# Patient Record
Sex: Female | Born: 1990 | Race: White | Hispanic: No | Marital: Single | Smoking: Current every day smoker
Health system: Southern US, Community
[De-identification: ages and names within clinical notes are randomized; demographics above are authoritative.]

## PROBLEM LIST (undated history)

## (undated) DIAGNOSIS — E669 Obesity, unspecified: Secondary | ICD-10-CM

## (undated) DIAGNOSIS — F4310 Post-traumatic stress disorder, unspecified: Secondary | ICD-10-CM

## (undated) DIAGNOSIS — F419 Anxiety disorder, unspecified: Secondary | ICD-10-CM

## (undated) DIAGNOSIS — F319 Bipolar disorder, unspecified: Secondary | ICD-10-CM

## (undated) DIAGNOSIS — J45909 Unspecified asthma, uncomplicated: Secondary | ICD-10-CM

## (undated) DIAGNOSIS — M419 Scoliosis, unspecified: Secondary | ICD-10-CM

## (undated) HISTORY — PX: KNEE SURGERY: SHX244

## (undated) HISTORY — PX: APPENDECTOMY: SHX54

## (undated) HISTORY — PX: ADENOIDECTOMY: SUR15

## (undated) HISTORY — PX: CHOLECYSTECTOMY: SHX55

## (undated) HISTORY — PX: WISDOM TOOTH EXTRACTION: SHX21

---

## 2016-11-13 ENCOUNTER — Emergency Department (HOSPITAL_COMMUNITY): Payer: Self-pay

## 2016-11-13 ENCOUNTER — Encounter (HOSPITAL_COMMUNITY): Payer: Self-pay | Admitting: *Deleted

## 2016-11-13 ENCOUNTER — Emergency Department (HOSPITAL_COMMUNITY)
Admission: EM | Admit: 2016-11-13 | Discharge: 2016-11-13 | Disposition: A | Discharge status: Home / Self Care | Payer: Self-pay | Attending: Emergency Medicine | Admitting: Emergency Medicine

## 2016-11-13 DIAGNOSIS — R262 Difficulty in walking, not elsewhere classified: Secondary | ICD-10-CM | POA: Insufficient documentation

## 2016-11-13 DIAGNOSIS — M79675 Pain in left toe(s): Secondary | ICD-10-CM | POA: Insufficient documentation

## 2016-11-13 DIAGNOSIS — J45909 Unspecified asthma, uncomplicated: Secondary | ICD-10-CM | POA: Insufficient documentation

## 2016-11-13 DIAGNOSIS — F17210 Nicotine dependence, cigarettes, uncomplicated: Secondary | ICD-10-CM | POA: Insufficient documentation

## 2016-11-13 HISTORY — DX: Obesity, unspecified: E66.9

## 2016-11-13 HISTORY — DX: Scoliosis, unspecified: M41.9

## 2016-11-13 HISTORY — DX: Unspecified asthma, uncomplicated: J45.909

## 2016-11-13 NOTE — Discharge Instructions (Signed)
Follow-up with foot specialists listed below for further evaluation. Take ibuprofen or Aleve as needed for pain and inflammation. Weight-bear as tolerated. Return to ED for worsening pain, increased redness, injury or signs of infection.

## 2016-11-13 NOTE — ED Provider Notes (Signed)
[facility]-EMERGENCY DEPT Provider Note   CSN: [id number] Arrival date & time: 11/13/16  1459     History   Chief Complaint Chief Complaint  Patient presents with  . Toe Pain    HPI Katherine Rivas is a 26 y.o. female.  HPI  Patient presents to ED for evaluation of 2 day history of left pinky toe pain. Describes the pain as throbbing and worse with weightbearing. Symptoms began after waking up 2 days ago. She is unsure if she injured the area. She is using crutches with some relief in her symptoms. She has tried ibuprofen, Tylenol with some relief in her symptoms. She does have a history of similar symptoms in her right foot several years ago which resolved on its own with buddy tape. She denies any numbness, fevers, chills, drainage from site.  Past Medical History:  Diagnosis Date  . Asthma   . Obesity   . Scoliosis     There are no active problems to display for this patient.   Past Surgical History:  Procedure Laterality Date  . APPENDECTOMY    . CHOLECYSTECTOMY    . KNEE SURGERY    . TONSILLECTOMY      OB History    No data available       Home Medications    Prior to Admission medications   Not on File    Family History No family history on file.  Social History Social History  Substance Use Topics  . Smoking status: Current Every Day Smoker    Packs/day: 0.35    Types: Cigarettes  . Smokeless tobacco: Never Used  . Alcohol use No     Allergies   Zithromax [azithromycin]   Review of Systems Review of Systems  Constitutional: Negative for chills and fever.  Musculoskeletal: Positive for arthralgias and gait problem. Negative for joint swelling and myalgias.  Skin: Negative for color change, rash and wound.     Physical Exam Updated Vital Signs BP 113/70 (BP Location: Left Arm)   Pulse 93   Temp 98.1 F (36.7 C) (Oral)   Resp 18   LMP 10/18/2016 (Approximate)   SpO2 98%   Physical Exam  Constitutional: She appears  well-developed and well-nourished. No distress.  HENT:  Head: Normocephalic and atraumatic.  Eyes: Conjunctivae and EOM are normal. No scleral icterus.  Neck: Normal range of motion.  Pulmonary/Chest: Effort normal. No respiratory distress.  Musculoskeletal: She exhibits tenderness. She exhibits no edema or deformity.  Tenderness to palpation of the left pinky toe. Mild edema noted. No color or temperature change noted. No palpable abscess or site of drainage noted. No visible wound. Pain with flexion of toe.  Neurological: She is alert.  Skin: No rash noted. She is not diaphoretic.  Psychiatric: She has a normal mood and affect.  Nursing note and vitals reviewed.    ED Treatments / Results  Labs (all labs ordered are listed, but only abnormal results are displayed) Labs Reviewed - No data to display  EKG  EKG Interpretation None       Radiology Dg Foot Complete Left  Result Date: 11/13/2016 CLINICAL DATA:  Lateral left foot pain, including the fourth and fifth toes. No known injury. EXAM: LEFT FOOT - COMPLETE 3+ VIEW COMPARISON:  None. FINDINGS: There is no evidence of fracture or dislocation. There is no evidence of arthropathy or other focal bone abnormality. Soft tissues are unremarkable. IMPRESSION: Normal examination. Electronically Signed   By: Zada Finders.D.  On: 11/13/2016 17:35    Procedures Procedures (including critical care time)  Medications Ordered in ED Medications - No data to display   Initial Impression / Assessment and Plan / ED Course  I have reviewed the triage vital signs and the nursing notes.  Pertinent labs & imaging results that were available during my care of the patient were reviewed by me and considered in my medical decision making (see chart for details).     Patient presents to ED for left pinky toe pain for the past 2 days. Unsure of injury. Some relief in symptoms with anti-inflammatory medications. There is tenderness present on  the pinky toe but no evidence of paronychia or other infectious cause. No visible deformity or wound noted. X-ray of foot returned as negative for acute abnormality of the bones or soft tissues. Patient reports relief in the past with similar symptoms on the right foot with buddy tape. We'll buddy tape these toes and advised to continue anti-inflammatories as needed. Will refer to podiatry for further evaluation if symptoms persist. Strict return precautions given.  Final Clinical Impressions(s) / ED Diagnoses   Final diagnoses:  Pain of toe of left foot    New Prescriptions New Prescriptions   No medications on file     Dietrich PatesKhatri, Laurann Mcmorris, Cordelia Poche-C 11/13/16 1818    Linwood DibblesKnapp, Jon, MD 11/13/16 2337

## 2016-11-13 NOTE — ED Triage Notes (Signed)
Pt c/o L pinky toe pain onset x 2 days, pt denies injury to the area, no redness to the toe, small amt of swelling present, skin intact, A&o X4

## 2017-06-22 ENCOUNTER — Emergency Department (HOSPITAL_COMMUNITY)
Admission: EM | Admit: 2017-06-22 | Discharge: 2017-06-22 | Disposition: A | Discharge status: Home / Self Care | Payer: Self-pay | Attending: Emergency Medicine | Admitting: Emergency Medicine

## 2017-06-22 ENCOUNTER — Encounter (HOSPITAL_COMMUNITY): Payer: Self-pay

## 2017-06-22 DIAGNOSIS — F17210 Nicotine dependence, cigarettes, uncomplicated: Secondary | ICD-10-CM | POA: Insufficient documentation

## 2017-06-22 DIAGNOSIS — Y999 Unspecified external cause status: Secondary | ICD-10-CM | POA: Insufficient documentation

## 2017-06-22 DIAGNOSIS — S39012A Strain of muscle, fascia and tendon of lower back, initial encounter: Secondary | ICD-10-CM | POA: Insufficient documentation

## 2017-06-22 DIAGNOSIS — T148XXA Other injury of unspecified body region, initial encounter: Secondary | ICD-10-CM

## 2017-06-22 DIAGNOSIS — J45909 Unspecified asthma, uncomplicated: Secondary | ICD-10-CM | POA: Insufficient documentation

## 2017-06-22 DIAGNOSIS — Y929 Unspecified place or not applicable: Secondary | ICD-10-CM | POA: Insufficient documentation

## 2017-06-22 DIAGNOSIS — X501XXA Overexertion from prolonged static or awkward postures, initial encounter: Secondary | ICD-10-CM | POA: Insufficient documentation

## 2017-06-22 DIAGNOSIS — Y939 Activity, unspecified: Secondary | ICD-10-CM | POA: Insufficient documentation

## 2017-06-22 HISTORY — DX: Post-traumatic stress disorder, unspecified: F43.10

## 2017-06-22 HISTORY — DX: Anxiety disorder, unspecified: F41.9

## 2017-06-22 HISTORY — DX: Bipolar disorder, unspecified: F31.9

## 2017-06-22 MED ORDER — METHOCARBAMOL 500 MG PO TABS: 500.0000 mg | ORAL_TABLET | Freq: Two times a day (BID) | ORAL | 0 refills | Status: DC

## 2017-06-22 MED ORDER — DICLOFENAC SODIUM 75 MG PO TBEC: 75.0000 mg | DELAYED_RELEASE_TABLET | Freq: Two times a day (BID) | ORAL | 0 refills | Status: DC

## 2017-06-22 NOTE — ED Triage Notes (Signed)
Patient states she was sitting on the toilet yesterday and was stretching her back out when she felt and heard a pop. Patient states she had right lower back pain when she tried to get off of the toilet.  Patient states pain in the right leg when she lays a certain way.

## 2017-06-22 NOTE — ED Provider Notes (Signed)
[facility]-EMERGENCY DEPT Provider Note   CSN: [id number] Arrival date & time: 06/22/17  1330     History   Chief Complaint Chief Complaint  Patient presents with  . Back Pain    HPI Katherine Rivas is a 27 y.o. female.  The history is provided by the patient. No language interpreter was used.  Back Pain   This is a new problem. The current episode started 2 days ago. The problem occurs constantly. The problem has not changed since onset.The pain is associated with twisting. The pain is present in the lumbar spine. The quality of the pain is described as aching. The pain does not radiate. The pain is moderate. Pertinent negatives include no chest pain and no abdominal pain. She has tried nothing for the symptoms. The treatment provided no relief.  Pt complains of twisting to pop her back and having sudden pain  Past Medical History:  Diagnosis Date  . Anxiety   . Asthma   . Bipolar 1 disorder (HCC)   . Obesity   . PTSD (post-traumatic stress disorder)   . Scoliosis     There are no active problems to display for this patient.   Past Surgical History:  Procedure Laterality Date  . ADENOIDECTOMY    . APPENDECTOMY    . CHOLECYSTECTOMY    . KNEE SURGERY       OB History   None      Home Medications    Prior to Admission medications   Medication Sig Start Date End Date Taking? Authorizing Provider  diclofenac (VOLTAREN) 75 MG EC tablet Take 1 tablet (75 mg total) by mouth 2 (two) times daily. 06/22/17   Elson Areas, PA-C  methocarbamol (ROBAXIN) 500 MG tablet Take 1 tablet (500 mg total) by mouth 2 (two) times daily. 06/22/17   Elson Areas, PA-C    Family History Family History  Problem Relation Age of Onset  . Anxiety disorder Mother     Social History Social History   Tobacco Use  . Smoking status: Current Every Day Smoker    Packs/day: 0.35    Types: Cigarettes  . Smokeless tobacco: Never Used  Substance Use Topics   . Alcohol use: No  . Drug use: No     Allergies   Pumpkin flavor and Zithromax [azithromycin]   Review of Systems Review of Systems  Cardiovascular: Negative for chest pain.  Gastrointestinal: Negative for abdominal pain.  Musculoskeletal: Positive for back pain.  All other systems reviewed and are negative.    Physical Exam Updated Vital Signs BP (!) 138/96 (BP Location: Right Arm)   Pulse 85   Temp 97.7 F (36.5 C) (Oral)   Resp 18   Ht 5\' 7"  (1.702 m)   Wt 121.1 kg (267 lb)   LMP 05/14/2017 Comment: irregular  SpO2 99%   BMI 41.82 kg/m   Physical Exam  Constitutional: She is oriented to person, place, and time. She appears well-developed and well-nourished.  HENT:  Head: Normocephalic.  Eyes: EOM are normal.  Neck: Normal range of motion.  Pulmonary/Chest: Effort normal.  Abdominal: She exhibits no distension.  Musculoskeletal: Normal range of motion. She exhibits tenderness.  Tender right lower back, spine nontender  Pain with movement.  nv and ns intact  Neurological: She is alert and oriented to person, place, and time.  Psychiatric: She has a normal mood and affect.  Nursing note and vitals reviewed.    ED Treatments / Results  Labs (all labs ordered are listed, but only abnormal results are displayed) Labs Reviewed - No data to display  EKG None  Radiology No results found.  Procedures Procedures (including critical care time)  Medications Ordered in ED Medications - No data to display   Initial Impression / Assessment and Plan / ED Course  I have reviewed the triage vital signs and the nursing notes.  Pertinent labs & imaging results that were available during my care of the patient were reviewed by me and considered in my medical decision making (see chart for details).    MDM  Pain seems muscular  Pt counseled on muscle strain.  Pt advised to follow up with wellness clinic if pain persist.  Final Clinical Impressions(s) / ED  Diagnoses   Final diagnoses:  Muscle strain    ED Discharge Orders        Ordered    diclofenac (VOLTAREN) 75 MG EC tablet  2 times daily     06/22/17 1447    methocarbamol (ROBAXIN) 500 MG tablet  2 times daily     06/22/17 1447    An After Visit Summary was printed and given to the patient.    Elson AreasSofia, Leslie K, [place]PA-C 06/22/17 1509    Nira Connardama, Pedro Eduardo, MD 06/24/17 [phone]

## 2017-06-30 ENCOUNTER — Emergency Department (HOSPITAL_COMMUNITY)
Admission: EM | Admit: 2017-06-30 | Discharge: 2017-06-30 | Discharge status: Against Medical Advice | Payer: Self-pay | Attending: Emergency Medicine | Admitting: Emergency Medicine

## 2017-06-30 DIAGNOSIS — Z5321 Procedure and treatment not carried out due to patient leaving prior to being seen by health care provider: Secondary | ICD-10-CM | POA: Insufficient documentation

## 2017-07-03 ENCOUNTER — Encounter (HOSPITAL_COMMUNITY): Payer: Self-pay | Admitting: Emergency Medicine

## 2017-07-03 ENCOUNTER — Emergency Department (HOSPITAL_COMMUNITY): Payer: Self-pay

## 2017-07-03 ENCOUNTER — Other Ambulatory Visit: Payer: Self-pay

## 2017-07-03 ENCOUNTER — Emergency Department (HOSPITAL_COMMUNITY)
Admission: EM | Admit: 2017-07-03 | Discharge: 2017-07-03 | Disposition: A | Discharge status: Home / Self Care | Payer: Self-pay | Attending: Emergency Medicine | Admitting: Emergency Medicine

## 2017-07-03 DIAGNOSIS — S6992XA Unspecified injury of left wrist, hand and finger(s), initial encounter: Secondary | ICD-10-CM | POA: Insufficient documentation

## 2017-07-03 DIAGNOSIS — Y929 Unspecified place or not applicable: Secondary | ICD-10-CM | POA: Insufficient documentation

## 2017-07-03 DIAGNOSIS — Y999 Unspecified external cause status: Secondary | ICD-10-CM | POA: Insufficient documentation

## 2017-07-03 DIAGNOSIS — F17210 Nicotine dependence, cigarettes, uncomplicated: Secondary | ICD-10-CM | POA: Insufficient documentation

## 2017-07-03 DIAGNOSIS — J45909 Unspecified asthma, uncomplicated: Secondary | ICD-10-CM | POA: Insufficient documentation

## 2017-07-03 DIAGNOSIS — W19XXXA Unspecified fall, initial encounter: Secondary | ICD-10-CM | POA: Insufficient documentation

## 2017-07-03 DIAGNOSIS — Y939 Activity, unspecified: Secondary | ICD-10-CM | POA: Insufficient documentation

## 2017-07-03 NOTE — ED Provider Notes (Signed)
[facility]-EMERGENCY DEPT Provider Note   CSN: [id number] Arrival date & time: 07/03/17  1256     History   Chief Complaint Chief Complaint  Patient presents with  . Hand Injury    HPI Katherine Rivas is a 27 y.o. female who presents to the ED with left hand pain. Patient reports falling 5 days ago and since then the hand has been swollen and painful.   The history is provided by the patient. No language interpreter was used.  Hand Injury   The incident occurred more than 2 days ago. The injury mechanism was a fall. The quality of the pain is described as aching. The pain is moderate. The pain has been constant since the incident. She reports no foreign bodies present. She has tried nothing for the symptoms.    Past Medical History:  Diagnosis Date  . Anxiety   . Asthma   . Bipolar 1 disorder (HCC)   . Obesity   . PTSD (post-traumatic stress disorder)   . Scoliosis     There are no active problems to display for this patient.   Past Surgical History:  Procedure Laterality Date  . ADENOIDECTOMY    . APPENDECTOMY    . CHOLECYSTECTOMY    . KNEE SURGERY    . WISDOM TOOTH EXTRACTION       OB History   None      Home Medications    Prior to Admission medications   Medication Sig Start Date End Date Taking? Authorizing Provider  diclofenac (VOLTAREN) 75 MG EC tablet Take 1 tablet (75 mg total) by mouth 2 (two) times daily. 06/22/17   Elson Areas, PA-C  methocarbamol (ROBAXIN) 500 MG tablet Take 1 tablet (500 mg total) by mouth 2 (two) times daily. 06/22/17   Elson Areas, PA-C    Family History Family History  Problem Relation Age of Onset  . Anxiety disorder Mother     Social History Social History   Tobacco Use  . Smoking status: Current Every Day Smoker    Packs/day: 0.35    Types: Cigarettes  . Smokeless tobacco: Never Used  Substance Use Topics  . Alcohol use: Yes    Comment: occ  . Drug use: No      Allergies   Pumpkin flavor and Zithromax [azithromycin]   Review of Systems Review of Systems  Musculoskeletal: Positive for arthralgias.       Left hand pain  All other systems reviewed and are negative.    Physical Exam Updated Vital Signs BP 139/84 (BP Location: Right Arm)   Pulse 96   Temp 97.7 F (36.5 C) (Oral)   Resp 18   Wt 121.6 kg (268 lb)   LMP 06/23/2017 (Exact Date)   SpO2 98%   BMI 41.97 kg/m   Physical Exam  Constitutional: She appears well-developed and well-nourished. No distress.  HENT:  Head: Normocephalic and atraumatic.  Eyes: EOM are normal.  Neck: Neck supple.  Cardiovascular: Normal rate.  Pulmonary/Chest: Effort normal.  Musculoskeletal: Normal range of motion.       Left hand: She exhibits tenderness. She exhibits normal range of motion, normal capillary refill, no deformity, no laceration and no swelling. Normal sensation noted. Normal strength noted. She exhibits no thumb/finger opposition.       Hands: Neurological: She is alert.  Skin: Skin is warm and dry.  Psychiatric: She has a normal mood and affect.  Nursing note and vitals reviewed.  ED Treatments / Results  Labs (all labs ordered are listed, but only abnormal results are displayed) Labs Reviewed - No data to display  Radiology Dg Hand Complete Left  Result Date: 07/03/2017 CLINICAL DATA:  Pain to LEFT hand. Fell 5 days ago. Pain continues. Fifth metacarpal discomfort is reported. EXAM: LEFT HAND - COMPLETE 3+ VIEW COMPARISON:  None. FINDINGS: There is no evidence of fracture or dislocation. There is no evidence of arthropathy or other focal bone abnormality. Soft tissues are unremarkable. IMPRESSION: Negative. Electronically Signed   By: Elsie Stain M.D.   On: 07/03/2017 15:25    Procedures Procedures (including critical care time)  Medications Ordered in ED Medications - No data to display   Initial Impression / Assessment and Plan / ED Course  I have  reviewed the triage vital signs and the nursing notes. 27 y.o. female with left hand pain s/p fall 5 days ago stable for d/c without fracture or dislocation noted on x ray. No focal neuro deficits. Ace wrap, ice, elevation and pain management. Return precautions discussed.   Final Clinical Impressions(s) / ED Diagnoses   Final diagnoses:  Injury of left hand, initial encounter    ED Discharge Orders    None       Kerrie [place], [place] 07/04/17 2033    Jacalyn Lefevre, MD 07/07/17 [phone]

## 2017-07-03 NOTE — ED Triage Notes (Signed)
Pt stated that she fell on l/hand 5 days ago. Swelling noted on l/side of hand. No obvious bruising, slight swelling noted. Pt stated decreased rom of fingers

## 2017-07-03 NOTE — Discharge Instructions (Addendum)
Take tylenol and ibuprofen as needed for pain. Follow up with your primary care doctor or return here as needed.

## 2017-07-18 ENCOUNTER — Other Ambulatory Visit: Payer: Self-pay

## 2017-07-18 ENCOUNTER — Emergency Department (HOSPITAL_COMMUNITY)
Admission: EM | Admit: 2017-07-18 | Discharge: 2017-07-18 | Disposition: A | Discharge status: Home / Self Care | Payer: Medicaid Other | Attending: Physician Assistant | Admitting: Physician Assistant

## 2017-07-18 ENCOUNTER — Emergency Department (HOSPITAL_COMMUNITY): Payer: Medicaid Other

## 2017-07-18 ENCOUNTER — Encounter (HOSPITAL_COMMUNITY): Payer: Self-pay | Admitting: Emergency Medicine

## 2017-07-18 DIAGNOSIS — S8002XA Contusion of left knee, initial encounter: Secondary | ICD-10-CM | POA: Insufficient documentation

## 2017-07-18 DIAGNOSIS — Y929 Unspecified place or not applicable: Secondary | ICD-10-CM | POA: Insufficient documentation

## 2017-07-18 DIAGNOSIS — Y9389 Activity, other specified: Secondary | ICD-10-CM | POA: Insufficient documentation

## 2017-07-18 DIAGNOSIS — J45909 Unspecified asthma, uncomplicated: Secondary | ICD-10-CM | POA: Insufficient documentation

## 2017-07-18 DIAGNOSIS — Z79899 Other long term (current) drug therapy: Secondary | ICD-10-CM | POA: Insufficient documentation

## 2017-07-18 DIAGNOSIS — Y999 Unspecified external cause status: Secondary | ICD-10-CM | POA: Insufficient documentation

## 2017-07-18 DIAGNOSIS — W108XXA Fall (on) (from) other stairs and steps, initial encounter: Secondary | ICD-10-CM | POA: Insufficient documentation

## 2017-07-18 DIAGNOSIS — F17210 Nicotine dependence, cigarettes, uncomplicated: Secondary | ICD-10-CM | POA: Insufficient documentation

## 2017-07-18 DIAGNOSIS — S80812A Abrasion, left lower leg, initial encounter: Secondary | ICD-10-CM | POA: Insufficient documentation

## 2017-07-18 MED ORDER — LIDOCAINE HCL URETHRAL/MUCOSAL 2 % EX GEL
1.0000 "application " | Freq: Once | CUTANEOUS | Status: AC
  Administered 2017-07-18: 1 via TOPICAL
  Filled 2017-07-18: qty 5

## 2017-07-18 MED ORDER — BACITRACIN ZINC 500 UNIT/GM EX OINT
TOPICAL_OINTMENT | Freq: Two times a day (BID) | CUTANEOUS | Status: DC
  Administered 2017-07-18: 1 via TOPICAL
  Filled 2017-07-18: qty 0.9

## 2017-07-18 MED ORDER — IBUPROFEN 200 MG PO TABS
600.0000 mg | ORAL_TABLET | Freq: Once | ORAL | Status: AC
  Administered 2017-07-18: 600 mg via ORAL
  Filled 2017-07-18: qty 3

## 2017-07-18 MED ORDER — NAPROXEN 500 MG PO TABS: 500.0000 mg | ORAL_TABLET | Freq: Two times a day (BID) | ORAL | 0 refills | Status: DC

## 2017-07-18 NOTE — ED Provider Notes (Signed)
[facility]-EMERGENCY DEPT Provider Note   CSN: [id number] Arrival date & time: 07/18/17  1430     History   Chief Complaint Chief Complaint  Patient presents with  . Fall  . Knee Injury    HPI Katherine Rivas is a 27 y.o. female who presents to the ED with knee injury. Patient reports she was going up concrete steps carrying a small TV and tripped and fell on her left knee and then scraped the area below the knee on the concrete. Patient denies head injury, LOC or other injuries. Patient is up to date on tetanus.  HPI  Past Medical History:  Diagnosis Date  . Anxiety   . Asthma   . Bipolar 1 disorder (HCC)   . Obesity   . PTSD (post-traumatic stress disorder)   . Scoliosis     There are no active problems to display for this patient.   Past Surgical History:  Procedure Laterality Date  . ADENOIDECTOMY    . APPENDECTOMY    . CHOLECYSTECTOMY    . KNEE SURGERY    . WISDOM TOOTH EXTRACTION       OB History   None      Home Medications    Prior to Admission medications   Medication Sig Start Date End Date Taking? Authorizing Provider  ARIPiprazole (ABILIFY MAINTENA IM) Inject 400 mg into the muscle every 30 (thirty) days.   Yes [provider]  diphenhydrAMINE (BENADRYL) 25 MG tablet Take 50 mg by mouth every 6 (six) hours as needed for allergies or sleep.   Yes [provider]  gabapentin (NEURONTIN) 300 MG capsule Take 600 mg by mouth daily.   Yes [provider]  traZODone (DESYREL) 100 MG tablet Take 200 mg by mouth at bedtime.   Yes [provider]  naproxen (NAPROSYN) 500 MG tablet Take 1 tablet (500 mg total) by mouth 2 (two) times daily. 07/18/17   Janne Napoleon, NP    Family History Family History  Problem Relation Age of Onset  . Anxiety disorder Mother     Social History Social History   Tobacco Use  . Smoking status: Current Every Day Smoker    Packs/day: 0.35    Types:  Cigarettes  . Smokeless tobacco: Never Used  Substance Use Topics  . Alcohol use: Yes    Comment: occ  . Drug use: No     Allergies   Pumpkin flavor and Zithromax [azithromycin]   Review of Systems Review of Systems  Musculoskeletal: Positive for arthralgias.       Left knee pain  Skin: Positive for wound.  All other systems reviewed and are negative.    Physical Exam Updated Vital Signs BP 119/85 (BP Location: Left Arm)   Pulse 92   Temp 97.8 F (36.6 C) (Oral)   Resp 18   Wt 121.6 kg (268 lb)   LMP 06/23/2017 (Exact Date)   SpO2 100%   BMI 41.97 kg/m   Physical Exam  Constitutional: She appears well-developed and well-nourished. No distress.  HENT:  Head: Normocephalic.  Eyes: EOM are normal.  Neck: Neck supple.  Cardiovascular: Normal rate and intact distal pulses.  Pulmonary/Chest: Effort normal.  Musculoskeletal: Normal range of motion.       Left knee: She exhibits swelling. She exhibits no ecchymosis, no deformity, no erythema and normal alignment. Decreased range of motion: due to pain. Tenderness found.  Neurological: She is alert.  Skin: Skin is warm and dry.  Abrasion left lower leg  Psychiatric: She has a normal mood and affect. Her behavior is normal.  Nursing note and vitals reviewed.    ED Treatments / Results  Labs (all labs ordered are listed, but only abnormal results are displayed) Labs Reviewed - No data to display Radiology Dg Knee Complete 4 Views Left  Result Date: 07/18/2017 CLINICAL DATA:  Pt states she was carrying a TV up concrete stairs today and fell. Abrasion noted to anterior left knee. EXAM: LEFT KNEE - COMPLETE 4+ VIEW COMPARISON:  None. FINDINGS: No evidence of fracture, dislocation, or joint effusion. No evidence of arthropathy or other focal bone abnormality. Soft tissues are unremarkable. IMPRESSION: Negative. Electronically Signed   By: Norva Pavlov M.D.   On: 07/18/2017 15:31    Procedures wounds cleaned,  bacitracin ointment and dressing applied by RN, Ace wrap applied. Patient has crutches to use if needed.  Procedures (including critical care time)  Medications Ordered in ED Medications  lidocaine (XYLOCAINE) 2 % jelly 1 application (has no administration in time range)  bacitracin ointment (has no administration in time range)  ibuprofen (ADVIL,MOTRIN) tablet 600 mg (600 mg Oral Given 07/18/17 1602)     Initial Impression / Assessment and Plan / ED Course  I have reviewed the triage vital signs and the nursing notes. 27 y.o. female with contusion to the left knee and abrasion to the left lower leg stable for d/c without fracture or dislocation noted on x-ray. Wound care, antibiotics ointment and dressing, ace wrap and ice. Return precautions discussed with the patient.   Final Clinical Impressions(s) / ED Diagnoses   Final diagnoses:  Contusion of left knee, initial encounter  Abrasion, left lower leg, initial encounter    ED Discharge Orders        Ordered    naproxen (NAPROSYN) 500 MG tablet  2 times daily     07/18/17 1542       Damian Leavell [place], [place] 07/18/17 1606    Abelino Derrick, MD 07/21/17 1516

## 2017-07-18 NOTE — Discharge Instructions (Addendum)
Clean the wound and apply neosporin twice a day. Follow up with your doctor or return here for worsening symptoms.

## 2017-07-18 NOTE — ED Triage Notes (Signed)
Pt stated that she slid on concrete step injuring her l/knee two hours ago. Multiple abrasions noted

## 2017-07-19 ENCOUNTER — Emergency Department (HOSPITAL_COMMUNITY): Payer: No Typology Code available for payment source

## 2017-07-19 ENCOUNTER — Encounter (HOSPITAL_COMMUNITY): Payer: Self-pay | Admitting: Emergency Medicine

## 2017-07-19 ENCOUNTER — Emergency Department (HOSPITAL_COMMUNITY)
Admission: EM | Admit: 2017-07-19 | Discharge: 2017-07-19 | Disposition: A | Discharge status: Home / Self Care | Payer: No Typology Code available for payment source | Attending: Emergency Medicine | Admitting: Emergency Medicine

## 2017-07-19 DIAGNOSIS — F17210 Nicotine dependence, cigarettes, uncomplicated: Secondary | ICD-10-CM | POA: Diagnosis not present

## 2017-07-19 DIAGNOSIS — Z79899 Other long term (current) drug therapy: Secondary | ICD-10-CM | POA: Insufficient documentation

## 2017-07-19 DIAGNOSIS — M25561 Pain in right knee: Secondary | ICD-10-CM | POA: Insufficient documentation

## 2017-07-19 DIAGNOSIS — J45909 Unspecified asthma, uncomplicated: Secondary | ICD-10-CM | POA: Insufficient documentation

## 2017-07-19 MED ORDER — MELOXICAM 7.5 MG PO TABS: 7.5000 mg | ORAL_TABLET | Freq: Every day | ORAL | 0 refills | Status: DC
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
## 2017-07-19 NOTE — ED Notes (Signed)
Bed: WTR8 Expected date:  Expected time:  Means of arrival:  Comments: 
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
## 2017-07-19 NOTE — Discharge Instructions (Signed)
I have given you a prescription for Mobic (meloxicam) today.  Mobic is a NSAID medication and you should not take it with other NSAIDs.  Examples of other NSAIDS include motrin, ibuprofen, aleve, naproxen, and Voltaren.  Please monitor your bowel movements for dark, tarry, sticky stools. If you have any bowel movements like this you need to stop taking mobic and call your doctor as this may represent a stomach ulcer from taking NSAIDS.    Please take Tylenol (acetaminophen) to relieve your pain.  You may take tylenol, up to 1,000 mg (two extra strength pills).  Do not take more than 3,000 mg tylenol in a 24 hour period.  Please check all medication labels as many medications such as pain and cold medications may contain tylenol. Please do not drink alcohol while taking this medication.   

## 2017-09-04 ENCOUNTER — Emergency Department (HOSPITAL_COMMUNITY): Payer: Self-pay

## 2017-09-04 ENCOUNTER — Encounter (HOSPITAL_COMMUNITY): Payer: Self-pay | Admitting: Emergency Medicine

## 2017-09-04 ENCOUNTER — Other Ambulatory Visit: Payer: Self-pay

## 2017-09-04 ENCOUNTER — Emergency Department (HOSPITAL_COMMUNITY)
Admission: EM | Admit: 2017-09-04 | Discharge: 2017-09-04 | Disposition: A | Discharge status: Home / Self Care | Payer: Self-pay | Attending: Emergency Medicine | Admitting: Emergency Medicine

## 2017-09-04 DIAGNOSIS — J45909 Unspecified asthma, uncomplicated: Secondary | ICD-10-CM | POA: Insufficient documentation

## 2017-09-04 DIAGNOSIS — F17210 Nicotine dependence, cigarettes, uncomplicated: Secondary | ICD-10-CM | POA: Insufficient documentation

## 2017-09-04 DIAGNOSIS — Y93G1 Activity, food preparation and clean up: Secondary | ICD-10-CM | POA: Insufficient documentation

## 2017-09-04 DIAGNOSIS — Z79899 Other long term (current) drug therapy: Secondary | ICD-10-CM | POA: Insufficient documentation

## 2017-09-04 DIAGNOSIS — Y998 Other external cause status: Secondary | ICD-10-CM | POA: Insufficient documentation

## 2017-09-04 DIAGNOSIS — W260XXA Contact with knife, initial encounter: Secondary | ICD-10-CM | POA: Insufficient documentation

## 2017-09-04 DIAGNOSIS — Y92000 Kitchen of unspecified non-institutional (private) residence as  the place of occurrence of the external cause: Secondary | ICD-10-CM | POA: Insufficient documentation

## 2017-09-04 DIAGNOSIS — S61211A Laceration without foreign body of left index finger without damage to nail, initial encounter: Secondary | ICD-10-CM | POA: Insufficient documentation

## 2017-09-04 MED ORDER — LIDOCAINE HCL (PF) 1 % IJ SOLN
5.0000 mL | Freq: Once | INTRAMUSCULAR | Status: AC
  Administered 2017-09-04: 5 mL
  Filled 2017-09-04: qty 30

## 2017-09-04 NOTE — Discharge Instructions (Addendum)
Please read and follow all provided instructions.  Your diagnoses today is a laceration. A laceration is a cut or lesion that goes through all layers of the skin and into the tissue just beneath the skin. This was repaired with 3 stitches or a tissue adhesive similar to a super glue.  Follow up with your doctor, an urgent care, or this Emergency Department for removal of your stitches in 7 days. Keep the wound clean and dry for the next 24 hours and leave the dressing in place. You may shower after 24 hours. Do not soak the area for long periods of times as in a bath until the sutures are removed. After 24 hours you may remove the dressing and gently clean the laceration site with antibacterial soap (i.e. Neosporin or Bacitracin) and warm water 2 times a day. Pat dry with clean towel. Do not scrub. Once the wound has healed, scarring can be minimized by covering the wound with sunscreen during the day for 1 full year.  Return instructions:  You have redness, swelling, or increasing pain in the wound.  You see a red line that goes away from the wound.  You have yellowish-white fluid (pus) coming from the wound.  You have a fever (above 100.33F) You notice a bad smell coming from the wound or dressing.  Your wound breaks open before or after sutures have been removed.  You notice something coming out of the wound such as wood or glass.  Your wound is on your hand or foot and you cannot move a finger or toe.  Your pain is not controlled with prescribed medicine.     Additional Information:  If you did not receive a tetanus shot today because you thought you were up to date, but did not recall when your last one was given, make sure to check with your primary caregiver to determine if you need one.   Your vital signs today were: BP 118/84 (BP Location: Right Arm)    Pulse 83    Temp 97.6 F (36.4 C) (Oral)    Resp 15    Ht 5\' 7"  (1.702 m)    Wt 124.3 kg (274 lb)    LMP 08/22/2017    SpO2 99%     BMI 42.91 kg/m  If your blood pressure (BP) was elevated above 135/85 this visit, please have this repeated by your doctor within one month.

## 2017-09-04 NOTE — ED Triage Notes (Signed)
Patient has a laceration on left pointer finger. Patient accidentally cut with a knife.

## 2017-09-04 NOTE — ED Provider Notes (Addendum)
[facility]-EMERGENCY DEPT Provider Note   CSN: [id number] Arrival date & time: 09/04/17  0556     History   Chief Complaint Chief Complaint  Patient presents with  . Laceration    HPI Katherine Rivas is a 27 y.o. right handed female who presents emergency department today for abrasion to the left pointer finger that occurred at approximately 2 AM this morning.  Patient states she awoke this morning and was hungry so she tried to cut ham off a shank when the knife slipped and she cut her left index finger.  Bleeding is currently controlled.  She has pain at the site of the laceration is aching/throbbing in nature and rates as a 9/10.  She denies any difficulty with range of motion.  She denies any numbness or tingling.  Her tetanus is up-to-date.  HPI  Past Medical History:  Diagnosis Date  . Anxiety   . Asthma   . Bipolar 1 disorder (HCC)   . Obesity   . PTSD (post-traumatic stress disorder)   . Scoliosis     There are no active problems to display for this patient.   Past Surgical History:  Procedure Laterality Date  . ADENOIDECTOMY    . APPENDECTOMY    . CHOLECYSTECTOMY    . KNEE SURGERY    . WISDOM TOOTH EXTRACTION       OB History   None      Home Medications    Prior to Admission medications   Medication Sig Start Date End Date Taking? Authorizing Provider  ARIPiprazole (ABILIFY MAINTENA IM) Inject 400 mg into the muscle every 30 (thirty) days.    [provider]  gabapentin (NEURONTIN) 300 MG capsule Take 600 mg by mouth daily.    [provider]  meloxicam (MOBIC) 7.5 MG tablet Take 1 tablet (7.5 mg total) by mouth daily. 07/19/17   Cristina GongHammond, Elizabeth W, PA-C  naproxen (NAPROSYN) 500 MG tablet Take 1 tablet (500 mg total) by mouth 2 (two) times daily. 07/18/17   Janne NapoleonNeese, Hope M, NP  traZODone (DESYREL) 100 MG tablet Take 200 mg by mouth at bedtime.    [provider]    Family History Family History    Problem Relation Age of Onset  . Anxiety disorder Mother     Social History Social History   Tobacco Use  . Smoking status: Current Every Day Smoker    Packs/day: 0.35    Types: Cigarettes  . Smokeless tobacco: Never Used  Substance Use Topics  . Alcohol use: Yes    Comment: occ  . Drug use: No     Allergies   Pumpkin flavor and Zithromax [azithromycin]   Review of Systems Review of Systems  Constitutional: Negative for fever.  Musculoskeletal: Negative for arthralgias and joint swelling.  Skin: Positive for wound.  Neurological: Negative for weakness and numbness.  All other systems reviewed and are negative.    Physical Exam Updated Vital Signs BP 118/84 (BP Location: Right Arm)   Pulse 83   Temp 97.6 F (36.4 C) (Oral)   Resp 15   Ht 5\' 7"  (1.702 m)   Wt 124.3 kg (274 lb)   SpO2 99%   BMI 42.91 kg/m   Physical Exam  Constitutional: She appears well-developed and well-nourished.  HENT:  Head: Normocephalic and atraumatic.  Right Ear: External ear normal.  Left Ear: External ear normal.  Eyes: Conjunctivae are normal. Right eye exhibits no discharge. Left eye exhibits no discharge. No  scleral icterus.  Cardiovascular:  Pulses:      Carotid pulses are 2+ on the left side. Pulmonary/Chest: Effort normal. No respiratory distress.  Musculoskeletal:  Left hand: There is a 1 cm laceration on the radial aspect of the left second finger just distal to the PIP.  Patient with full active and resisted range of motion for flexion/extension at the MCP/PIP and DIP isolated.  FDS and FDP are intact.  Sensation is intact to light touch distally as well as to the medial, ulnar and radial distributions of the hand.   Neurological: She is alert. She has normal strength. No sensory deficit.  Skin: Skin is warm and dry. Capillary refill takes less than 2 seconds. Laceration noted. No pallor.  Psychiatric: She has a normal mood and affect.  Nursing note and vitals  reviewed.    ED Treatments / Results  Labs (all labs ordered are listed, but only abnormal results are displayed) Labs Reviewed - No data to display  EKG None  Radiology Dg Finger Index Left  Result Date: 09/04/2017 CLINICAL DATA:  Index finger laceration this morning. EXAM: LEFT INDEX FINGER 2+V COMPARISON:  07/03/2017 FINDINGS: No opaque foreign body, fracture, or malalignment. IMPRESSION: No opaque foreign body or bony abnormality. Electronically Signed   By: Marnee Spring M.D.   On: 09/04/2017 07:43    Procedures .Marland KitchenLaceration Repair Date/Time: 09/04/2017 8:01 AM Performed by: Jacinto Halim, PA-C Authorized by: Jacinto Halim, PA-C   Consent:    Consent obtained:  Verbal   Consent given by:  Patient   Risks discussed:  Infection, need for additional repair, nerve damage, poor wound healing, poor cosmetic result, pain, retained foreign body, tendon damage and vascular damage   Alternatives discussed:  No treatment Anesthesia (see MAR for exact dosages):    Anesthesia method:  Nerve block   Block location:  Digital-left index finger   Block needle gauge:  25 G   Block anesthetic:  Lidocaine 1% w/o epi   Block technique:  Digital   Block injection procedure:  Anatomic landmarks identified, anatomic landmarks palpated, negative aspiration for blood, introduced needle and incremental injection   Block outcome:  Anesthesia achieved Laceration details:    Location:  Finger   Finger location:  L index finger   Length (cm):  1 Repair type:    Repair type:  Simple Pre-procedure details:    Preparation:  Patient was prepped and draped in usual sterile fashion and imaging obtained to evaluate for foreign bodies Exploration:    Hemostasis achieved with:  Direct pressure   Wound exploration: wound explored through full range of motion and entire depth of wound probed and visualized     Wound extent: no tendon damage noted   Treatment:    Area cleansed with:  Saline    Amount of cleaning:  Standard   Irrigation solution:  Sterile saline   Irrigation volume:  100   Irrigation method:  Syringe Skin repair:    Repair method:  Sutures   Suture size:  6-0   Wound skin closure material used: Ethilon.   Suture technique:  Simple interrupted   Number of sutures:  3 Approximation:    Approximation:  Close Post-procedure details:    Dressing:  Splint for protection and adhesive bandage   Patient tolerance of procedure:  Tolerated well, no immediate complications   (including critical care time)  Medications Ordered in ED Medications  lidocaine (PF) (XYLOCAINE) 1 % injection 5 mL (5 mLs Infiltration Given  09/04/17 0749)     Initial Impression / Assessment and Plan / ED Course  I have reviewed the triage vital signs and the nursing notes.  Pertinent labs & imaging results that were available during my care of the patient were reviewed by me and considered in my medical decision making (see chart for details).     27 year old female presenting with laceration of her nondominant left index finger.  There is no evidence of tendon injury.  She is neurovascularly intact.  X-rays without evidence of fracture or radiopaque foreign body. Pressure irrigation performed. Wound explored and base of wound visualized in a bloodless field without evidence of foreign body.  Laceration occurred < 8 hours prior to repair which was well tolerated. Tetanus is up to date.  Pt has no comorbidities to effect normal wound healing. Pt discharged  without antibiotics.  Patient was placed in finger splint for protection and avoid rupturing sutures.  Discussed suture home care with patient and answered questions. Pt to follow-up for wound check and suture removal in 7 days; they are to return to the ED sooner for signs of infection. Pt is hemodynamically stable with no complaints prior to dc.   Final Clinical Impressions(s) / ED Diagnoses   Final diagnoses:  Laceration of left index  finger without foreign body without damage to nail, initial encounter    ED Discharge Orders    None       Jacinto Halim, PA-C 09/04/17 0810    Jacinto Halim, PA-C 09/04/17 1610    Devoria Albe, MD 09/04/17 2258

## 2017-10-01 ENCOUNTER — Emergency Department (HOSPITAL_COMMUNITY)
Admission: EM | Admit: 2017-10-01 | Discharge: 2017-10-01 | Disposition: A | Discharge status: Home / Self Care | Payer: Medicaid Other | Attending: Emergency Medicine | Admitting: Emergency Medicine

## 2017-10-01 ENCOUNTER — Encounter (HOSPITAL_COMMUNITY): Payer: Self-pay | Admitting: Emergency Medicine

## 2017-10-01 DIAGNOSIS — F17210 Nicotine dependence, cigarettes, uncomplicated: Secondary | ICD-10-CM | POA: Insufficient documentation

## 2017-10-01 DIAGNOSIS — Z79899 Other long term (current) drug therapy: Secondary | ICD-10-CM | POA: Insufficient documentation

## 2017-10-01 DIAGNOSIS — J45909 Unspecified asthma, uncomplicated: Secondary | ICD-10-CM | POA: Insufficient documentation

## 2017-10-01 DIAGNOSIS — A084 Viral intestinal infection, unspecified: Secondary | ICD-10-CM | POA: Insufficient documentation

## 2017-10-01 LAB — COMPREHENSIVE METABOLIC PANEL
ALBUMIN: 3.5 g/dL (ref 3.5–5.0)
ALT: 38 U/L (ref 0–44)
AST: 21 U/L (ref 15–41)
Alkaline Phosphatase: 76 U/L (ref 38–126)
Anion gap: 6 (ref 5–15)
BILIRUBIN TOTAL: 0.5 mg/dL (ref 0.3–1.2)
BUN: 9 mg/dL (ref 6–20)
CO2: 27 mmol/L (ref 22–32)
CREATININE: 0.84 mg/dL (ref 0.44–1.00)
Calcium: 8.6 mg/dL — ABNORMAL LOW (ref 8.9–10.3)
Chloride: 107 mmol/L (ref 98–111)
GFR calc Af Amer: >60 mL/min (ref >60)
GLUCOSE: 104 mg/dL — AB (ref 70–99)
Potassium: 3.8 mmol/L (ref 3.5–5.1)
Sodium: 140 mmol/L (ref 135–145)
TOTAL PROTEIN: 6.2 g/dL — AB (ref 6.5–8.1)

## 2017-10-01 LAB — URINALYSIS, ROUTINE W REFLEX MICROSCOPIC
BILIRUBIN URINE: NEGATIVE
GLUCOSE, UA: NEGATIVE mg/dL
Hgb urine dipstick: NEGATIVE
KETONES UR: NEGATIVE mg/dL
LEUKOCYTES UA: NEGATIVE
NITRITE: NEGATIVE
PH: 6 (ref 5.0–8.0)
PROTEIN: NEGATIVE mg/dL
Specific Gravity, Urine: 1.011 (ref 1.005–1.030)

## 2017-10-01 LAB — CBC
HEMATOCRIT: 42.2 % (ref 36.0–46.0)
Hemoglobin: 14.3 g/dL (ref 12.0–15.0)
MCH: 32.5 pg (ref 26.0–34.0)
MCHC: 33.9 g/dL (ref 30.0–36.0)
MCV: 95.9 fL (ref 78.0–100.0)
PLATELETS: 282 10*3/uL (ref 150–400)
RBC: 4.4 MIL/uL (ref 3.87–5.11)
RDW: 12.4 % (ref 11.5–15.5)
WBC: 8.4 10*3/uL (ref 4.0–10.5)

## 2017-10-01 LAB — I-STAT BETA HCG BLOOD, ED (MC, WL, AP ONLY): I-stat hCG, quantitative: <5 m[IU]/mL (ref <5)

## 2017-10-01 LAB — LIPASE, BLOOD: Lipase: 39 U/L (ref 11–51)

## 2017-10-01 MED ORDER — ONDANSETRON HCL 4 MG/2ML IJ SOLN
4.0000 mg | Freq: Once | INTRAMUSCULAR | Status: AC
  Administered 2017-10-01: 4 mg via INTRAVENOUS
  Filled 2017-10-01: qty 2

## 2017-10-01 MED ORDER — ONDANSETRON HCL 4 MG PO TABS: 4.0000 mg | ORAL_TABLET | ORAL | 0 refills | Status: AC | PRN

## 2017-10-01 MED ORDER — SODIUM CHLORIDE 0.9 % IV BOLUS
1000.0000 mL | Freq: Once | INTRAVENOUS | Status: AC
  Administered 2017-10-01: 1000 mL via INTRAVENOUS

## 2017-10-01 NOTE — ED Provider Notes (Signed)
[facility]-EMERGENCY DEPT Provider Note   CSN: [id number] Arrival date & time: 10/01/17  1324     History   Chief Complaint Chief Complaint  Patient presents with  . Abdominal Pain  . Nausea  . Emesis    HPI DARCELL YACOUB is a 27 y.o. female.  27 y/o female with no PMH presents to the ED with a chief complaint of nausea, vomiting, and diarrhea which began yesterday.Patient states shortly after she had a bologna sandwich. Patient has been able to drink fluids and toast without vomiting. She states she had multiple episodes of diarrhea and constipation.She denies any hematochesia or hematemesis. Patient is also complaining of generalized abdominal pain with no radiation. The pain is worse when vomiting occurs. Patient has had a prior cholecystectomy and appendectomy. She denies any fever, urinary symptoms, chest pain or shortness of breath.      Past Medical History:  Diagnosis Date  . Anxiety   . Asthma   . Bipolar 1 disorder (HCC)   . Obesity   . PTSD (post-traumatic stress disorder)   . Scoliosis     There are no active problems to display for this patient.   Past Surgical History:  Procedure Laterality Date  . ADENOIDECTOMY    . APPENDECTOMY    . CHOLECYSTECTOMY    . KNEE SURGERY    . WISDOM TOOTH EXTRACTION       OB History   None      Home Medications    Prior to Admission medications   Medication Sig Start Date End Date Taking? Authorizing Provider  busPIRone (BUSPAR) 10 MG tablet Take 10 mg by mouth 2 (two) times daily.   Yes [provider]  gabapentin (NEURONTIN) 300 MG capsule Take 600 mg by mouth daily.   Yes [provider]  hydrOXYzine (VISTARIL) 100 MG capsule Take 100 mg by mouth 2 (two) times daily as needed for anxiety.    Yes [provider]  ibuprofen (ADVIL,MOTRIN) 200 MG tablet Take 400-600 mg by mouth every 6 (six) hours as needed for moderate pain.   Yes [provider]    lamoTRIgine (LAMICTAL) 25 MG tablet Take 25 mg by mouth daily.   Yes [provider]  traZODone (DESYREL) 100 MG tablet Take 200 mg by mouth at bedtime.   Yes [provider]  meloxicam (MOBIC) 7.5 MG tablet Take 1 tablet (7.5 mg total) by mouth daily. Patient not taking: Reported on 10/01/2017 07/19/17   Cristina Gong, PA-C  naproxen (NAPROSYN) 500 MG tablet Take 1 tablet (500 mg total) by mouth 2 (two) times daily. Patient not taking: Reported on 10/01/2017 07/18/17   Janne Napoleon, NP    Family History Family History  Problem Relation Age of Onset  . Anxiety disorder Mother     Social History Social History   Tobacco Use  . Smoking status: Current Every Day Smoker    Packs/day: 0.35    Types: Cigarettes  . Smokeless tobacco: Never Used  Substance Use Topics  . Alcohol use: Yes    Comment: occ  . Drug use: No     Allergies   Pumpkin flavor and Zithromax [azithromycin]   Review of Systems Review of Systems  Constitutional: Negative for chills and fever.  HENT: Negative for ear pain and sore throat.   Eyes: Negative for pain and visual disturbance.  Respiratory: Negative for cough, chest tightness, shortness of breath and wheezing.   Cardiovascular: Negative for chest  pain and palpitations.  Gastrointestinal: Positive for abdominal pain, constipation, diarrhea, nausea and vomiting. Negative for blood in stool and rectal pain.  Genitourinary: Negative for dysuria, flank pain and hematuria.  Musculoskeletal: Negative for arthralgias and back pain.  Skin: Negative for color change and rash.  Neurological: Negative for seizures and syncope.  All other systems reviewed and are negative.    Physical Exam Updated Vital Signs BP 126/81 (BP Location: Right Arm)   Pulse 70   Temp 98 F (36.7 C) (Oral)   Resp 18   SpO2 97%   Physical Exam  Constitutional: She is oriented to person, place, and time. She appears well-developed and well-nourished.  No distress.  HENT:  Head: Normocephalic and atraumatic.  Mouth/Throat: Uvula is midline and oropharynx is clear and moist. No trismus in the jaw. No dental abscesses or uvula swelling. No oropharyngeal exudate.  Poor dentition on exam.   Eyes: Pupils are equal, round, and reactive to light.  Neck: Normal range of motion.  Cardiovascular: Regular rhythm and normal heart sounds.  Pulmonary/Chest: Effort normal and breath sounds normal. No respiratory distress.  Abdominal: Soft. Bowel sounds are normal. She exhibits no distension and no fluid wave. There is generalized tenderness. There is no rigidity, no guarding, no CVA tenderness, no tenderness at McBurney's point and negative Murphy's sign.  Periumbilical pain upon palpation with no radiation, exacerbated with vomiting.  Musculoskeletal: She exhibits no tenderness or deformity.       Right lower leg: She exhibits no edema.       Left lower leg: She exhibits no edema.  Neurological: She is alert and oriented to person, place, and time.  Skin: Skin is warm and dry.  Psychiatric: She has a normal mood and affect.  Nursing note and vitals reviewed.    ED Treatments / Results  Labs (all labs ordered are listed, but only abnormal results are displayed) Labs Reviewed  COMPREHENSIVE METABOLIC PANEL - Abnormal; Notable for the following components:      Result Value   Glucose, Bld 104 (*)    Calcium 8.6 (*)    Total Protein 6.2 (*)    All other components within normal limits  LIPASE, BLOOD  CBC  URINALYSIS, ROUTINE W REFLEX MICROSCOPIC  I-STAT BETA HCG BLOOD, ED ([facility], [facility], [facility] ONLY)    EKG None  Radiology No results found.  Procedures Procedures (including critical care time)  Medications Ordered in ED Medications  sodium chloride 0.9 % bolus 1,000 mL (1,000 mLs Intravenous New Bag/Given 10/01/17 0844)  ondansetron (ZOFRAN) injection 4 mg (4 mg Intravenous Given 10/01/17 0845)     Initial Impression / Assessment and Plan /  ED Course  I have reviewed the triage vital signs and the nursing notes.  Pertinent labs & imaging results that were available during my care of the patient were reviewed by me and considered in my medical decision making (see chart for details).     Patient's lab results show no leukocytosis, patient is afebrile.UA showed no infection, leukocytosis, bacteria or nitrites. Patient has received bolus and zofran for the nausea,she states she is feeling much better at this time. Patient's vitals and improvement after fluids and zofran lead me to believe this is likely a viral gastroenteritis. Patient has completed PO challenge. At this time she is stable for discharge.    I have instructed to continue hydration with Gatorade and fluids.  I will prescribe patient some nausea medication to take at home as needed.  Dr.  Madilyn HookRees, has seen this patient and agrees with my plan and management.  Return precautions have been given to patient.  Final Clinical Impressions(s) / ED Diagnoses   Final diagnoses:  Viral gastroenteritis    ED Discharge Orders    None       Claude MangesSoto, Hadyn Azer, PA-C 10/01/17 1037    Tilden Fossaees, Elizabeth, MD 10/06/17 1550

## 2017-10-01 NOTE — ED Notes (Signed)
Patient tolerated juice well.

## 2017-10-01 NOTE — Discharge Instructions (Signed)
All laboratory results were within normal limits. Please continue to hydrate with fluids and Gatorade.Please return to the ED if you experience any of the following symptoms:  You have chest pain. You feel extremely weak or you faint. You see blood in your vomit. Your vomit looks like coffee grounds. You have bloody or black stools or stools that look like tar. You have a severe headache, a stiff neck, or both. You have a rash. You have severe pain, cramping, or bloating in your abdomen. You have trouble breathing or you are breathing very quickly. Your heart is beating very quickly. Your skin feels cold and clammy. You feel confused. You have pain when you urinate. You have signs of dehydration, such as: Dark urine, very little urine, or no urine. Cracked lips. Dry mouth. Sunken eyes. Sleepiness. Weakness.

## 2017-10-01 NOTE — ED Triage Notes (Signed)
Patient here from home with complaints of abdominal pain, nausea, vomiting x2 days.  

## 2017-12-11 ENCOUNTER — Emergency Department (HOSPITAL_COMMUNITY): Payer: Medicaid Other

## 2017-12-11 ENCOUNTER — Other Ambulatory Visit: Payer: Self-pay

## 2017-12-11 ENCOUNTER — Encounter (HOSPITAL_COMMUNITY): Payer: Self-pay

## 2017-12-11 ENCOUNTER — Emergency Department (HOSPITAL_COMMUNITY)
Admission: EM | Admit: 2017-12-11 | Discharge: 2017-12-12 | Disposition: A | Discharge status: Home / Self Care | Payer: Medicaid Other | Attending: Emergency Medicine | Admitting: Emergency Medicine

## 2017-12-11 DIAGNOSIS — B3731 Acute candidiasis of vulva and vagina: Secondary | ICD-10-CM

## 2017-12-11 DIAGNOSIS — Z79899 Other long term (current) drug therapy: Secondary | ICD-10-CM | POA: Insufficient documentation

## 2017-12-11 DIAGNOSIS — B373 Candidiasis of vulva and vagina: Secondary | ICD-10-CM | POA: Insufficient documentation

## 2017-12-11 DIAGNOSIS — J45909 Unspecified asthma, uncomplicated: Secondary | ICD-10-CM | POA: Insufficient documentation

## 2017-12-11 DIAGNOSIS — N201 Calculus of ureter: Secondary | ICD-10-CM | POA: Insufficient documentation

## 2017-12-11 DIAGNOSIS — F17210 Nicotine dependence, cigarettes, uncomplicated: Secondary | ICD-10-CM | POA: Insufficient documentation

## 2017-12-11 LAB — COMPREHENSIVE METABOLIC PANEL
ALK PHOS: 83 U/L (ref 38–126)
ALT: 28 U/L (ref 0–44)
AST: 22 U/L (ref 15–41)
Albumin: 4 g/dL (ref 3.5–5.0)
Anion gap: 9 (ref 5–15)
BILIRUBIN TOTAL: 0.6 mg/dL (ref 0.3–1.2)
BUN: 10 mg/dL (ref 6–20)
CALCIUM: 9.3 mg/dL (ref 8.9–10.3)
CO2: 25 mmol/L (ref 22–32)
CREATININE: 1 mg/dL (ref 0.44–1.00)
Chloride: 105 mmol/L (ref 98–111)
GFR calc non Af Amer: >60 mL/min (ref >60)
Glucose, Bld: 120 mg/dL — ABNORMAL HIGH (ref 70–99)
Potassium: 3.8 mmol/L (ref 3.5–5.1)
Sodium: 139 mmol/L (ref 135–145)
Total Protein: 7.4 g/dL (ref 6.5–8.1)

## 2017-12-11 LAB — URINALYSIS, ROUTINE W REFLEX MICROSCOPIC
Bilirubin Urine: NEGATIVE
Glucose, UA: NEGATIVE mg/dL
Ketones, ur: NEGATIVE mg/dL
Nitrite: NEGATIVE
PROTEIN: 30 mg/dL — AB
SPECIFIC GRAVITY, URINE: 1.01 (ref 1.005–1.030)
pH: 6 (ref 5.0–8.0)

## 2017-12-11 LAB — CBC
HCT: 44.7 % (ref 36.0–46.0)
Hemoglobin: 15.7 g/dL — ABNORMAL HIGH (ref 12.0–15.0)
MCH: 33.5 pg (ref 26.0–34.0)
MCHC: 35.1 g/dL (ref 30.0–36.0)
MCV: 95.3 fL (ref 78.0–100.0)
PLATELETS: 325 10*3/uL (ref 150–400)
RBC: 4.69 MIL/uL (ref 3.87–5.11)
RDW: 12.2 % (ref 11.5–15.5)
WBC: 9.8 10*3/uL (ref 4.0–10.5)

## 2017-12-11 LAB — WET PREP, GENITAL
Clue Cells Wet Prep HPF POC: NONE SEEN
SPERM: NONE SEEN
TRICH WET PREP: NONE SEEN

## 2017-12-11 LAB — I-STAT BETA HCG BLOOD, ED (MC, WL, AP ONLY): I-stat hCG, quantitative: <5 m[IU]/mL (ref <5)

## 2017-12-11 LAB — LIPASE, BLOOD: Lipase: 56 U/L — ABNORMAL HIGH (ref 11–51)

## 2017-12-11 MED ORDER — MORPHINE SULFATE (PF) 4 MG/ML IV SOLN
4.0000 mg | Freq: Once | INTRAVENOUS | Status: AC
  Administered 2017-12-11: 4 mg via INTRAVENOUS
  Filled 2017-12-11: qty 1

## 2017-12-11 MED ORDER — ONDANSETRON HCL 4 MG/2ML IJ SOLN
4.0000 mg | Freq: Once | INTRAMUSCULAR | Status: AC
  Administered 2017-12-11: 4 mg via INTRAVENOUS
  Filled 2017-12-11: qty 2

## 2017-12-11 MED ORDER — SODIUM CHLORIDE 0.9 % IV BOLUS
1000.0000 mL | Freq: Once | INTRAVENOUS | Status: AC
  Administered 2017-12-11: 1000 mL via INTRAVENOUS

## 2017-12-11 NOTE — ED Triage Notes (Signed)
Per pt, Yesterday morning pt woke up in pain in her right lower quadrant of her abdomen. Pt tried to eat but immediatly threw it up. Persistent feeling of nausea but no other emesis episodes. Pt is currently having some light discharge from her vagina or bright colored blood, no clots passed. Pt is early for her period. Today pain has increased to a unbearable level. Pt took tylenol with no affect. Pt has had appendectomy. Pt denies diarrhea, but last bowel movement was today.

## 2017-12-12 LAB — HIV ANTIBODY (ROUTINE TESTING W REFLEX): HIV SCREEN 4TH GENERATION: NONREACTIVE

## 2017-12-12 LAB — RPR: RPR Ser Ql: NONREACTIVE

## 2017-12-12 MED ORDER — FLUCONAZOLE 150 MG PO TABS: 150.0000 mg | ORAL_TABLET | Freq: Every day | ORAL | 0 refills | Status: AC

## 2017-12-12 MED ORDER — FLUCONAZOLE 150 MG PO TABS
150.0000 mg | ORAL_TABLET | Freq: Once | ORAL | Status: AC
  Administered 2017-12-12: 150 mg via ORAL
  Filled 2017-12-12: qty 1

## 2017-12-12 MED ORDER — KETOROLAC TROMETHAMINE 15 MG/ML IJ SOLN
15.0000 mg | Freq: Once | INTRAMUSCULAR | Status: AC
  Administered 2017-12-12: 15 mg via INTRAVENOUS
  Filled 2017-12-12: qty 1

## 2017-12-12 MED ORDER — NAPROXEN 500 MG PO TABS: 500.0000 mg | ORAL_TABLET | Freq: Two times a day (BID) | ORAL | 0 refills | Status: AC

## 2017-12-12 NOTE — Discharge Instructions (Signed)
Please read and follow all provided instructions.  Your diagnoses today include:  1. Ureteral stone   2. Vaginal yeast infection     Tests performed today include: Urine test that showed blood in your urine and no infection CT scan which showed a stone that recently passed into the bladder. Blood test that showed normal kidney function Vital signs. See below for your results today.   We tested for HIV and syphilis today.  These results will be available in 48 to 72 hours.  You can find them on your MyChart, or you will receive a call for any positive results.  You will not receive a call for any negative results.  You are also tested for gonorrhea and chlamydia today.  You will receive a call for any positive results in 48 to 72 hours.  Please refrain from unprotected sexual intercourse for the next 7 days.  If you do have a positive result, you will need to notify your partner.   Medications prescribed:   Take any prescribed medications only as directed.  You are prescribed Naproxen, a non-steroidal anti-inflammatory agent (NSAID) for pain. You may take 500 mg every 12 hours as needed for pain. If still requiring this medication around the clock for acute pain after 10 days, please see your primary healthcare provider.  Women who are pregnant, breastfeeding, or planning on becoming pregnant should not take non-steroidal anti-inflammatories such as Advil and Aleve. Tylenol is a safe over the counter pain reliever in pregnant women.  You may combine this medication with Tylenol, 650 mg every 6 hours, so you are receiving something for pain every 3 hours.  This is not a long-term medication unless under the care and direction of your primary provider. Taking this medication long-term and not under the supervision of a healthcare provider could increase the risk of stomach ulcers, kidney problems, and cardiovascular problems such as high blood pressure.   You may take Diflucan, 150 mg in 72  hours after your initial dose.  Home care instructions:  Follow any educational materials contained in this packet.  Please double your fluid intake for the next several days. Strain your urine and save any stones that may pass.   BE VERY CAREFUL not to take multiple medicines containing Tylenol (also called acetaminophen). Doing so can lead to an overdose which can damage your liver and cause liver failure and possibly death.   Follow-up instructions: Please follow-up with your urologist or the urologist referral (provided on front page) in the next 1 week for further evaluation of your symptoms.  If you need to return to the Emergency Department, go to [facility] and not [facility]. The urologists are located at [facility] and can better care for you at this location.  Return instructions:  If you need to return to the Emergency Department, go to [facility] and not [facility]. The urologists are located at [facility] and can better care for you at this location.  Please return to the Emergency Department if you experience worsening symptoms.  Please return if you develop fever or uncontrolled pain or vomiting. Please return if you have any other emergent concerns.  Additional Information:  Your vital signs today were: BP 111/70 (BP Location: Left Arm)    Pulse 84    Temp 98.2 F (36.8 C) (Oral)    Resp 16    Ht 5\' 7"  (1.702 m)    Wt 122.5 kg  LMP 12/11/2017    SpO2 100%    BMI 42.29 kg/m  If your blood pressure (BP) was elevated above 135/85 this visit, please have this repeated by your doctor within one month. --------------

## 2017-12-12 NOTE — ED Provider Notes (Signed)
Assumed care from PA [facility] at shift change.  See prior notes for full H&P.  Briefly, 27 y.o. F here with right sided abdominal pain.  She is s/p appendectomy.  CT renal study with findings of recently passed stone.  UA with large blood, otherwise labs reassuring.  Pelvic done, some yeast noted.  Plan:  Pelvic US pending.  Likely discharge.  Results for orders placed or performed during the hospital encounter of 12/11/17  Wet prep, genital  Result Value Ref Range   Yeast Wet Prep HPF POC PRESENT (A) NONE SEEN   Trich, Wet Prep NONE SEEN NONE SEEN   Clue Cells Wet Prep HPF POC NONE SEEN NONE SEEN   WBC, Wet Prep HPF POC MANY (A) NONE SEEN   Sperm NONE SEEN   Lipase, blood  Result Value Ref Range   Lipase 56 (H) 11 - 51 U/L  Comprehensive metabolic panel  Result Value Ref Range   Sodium 139 135 - 145 mmol/L   Potassium 3.8 3.5 - 5.1 mmol/L   Chloride 105 98 - 111 mmol/L   CO2 25 22 - 32 mmol/L   Glucose, Bld 120 (H) 70 - 99 mg/dL   BUN 10 6 - 20 mg/dL   Creatinine, Ser 1.61 0.44 - 1.00 mg/dL   Calcium 9.3 8.9 - 09.6 mg/dL   Total Protein 7.4 6.5 - 8.1 g/dL   Albumin 4.0 3.5 - 5.0 g/dL   AST 22 15 - 41 U/L   ALT 28 0 - 44 U/L   Alkaline Phosphatase 83 38 - 126 U/L   Total Bilirubin 0.6 0.3 - 1.2 mg/dL   GFR calc non Af Amer >60 >60 mL/min   GFR calc Af Amer >60 >60 mL/min   Anion gap 9 5 - 15  CBC  Result Value Ref Range   WBC 9.8 4.0 - 10.5 K/uL   RBC 4.69 3.87 - 5.11 MIL/uL   Hemoglobin 15.7 (H) 12.0 - 15.0 g/dL   HCT 04.5 40.9 - 81.1 %   MCV 95.3 78.0 - 100.0 fL   MCH 33.5 26.0 - 34.0 pg   MCHC 35.1 30.0 - 36.0 g/dL   RDW 91.4 78.2 - 95.6 %   Platelets 325 150 - 400 K/uL  Urinalysis, Routine w reflex microscopic  Result Value Ref Range   Color, Urine YELLOW YELLOW   APPearance HAZY (A) CLEAR   Specific Gravity, Urine 1.010 1.005 - 1.030   pH 6.0 5.0 - 8.0   Glucose, UA NEGATIVE NEGATIVE mg/dL   Hgb urine dipstick LARGE (A) NEGATIVE   Bilirubin Urine NEGATIVE  NEGATIVE   Ketones, ur NEGATIVE NEGATIVE mg/dL   Protein, ur 30 (A) NEGATIVE mg/dL   Nitrite NEGATIVE NEGATIVE   Leukocytes, UA SMALL (A) NEGATIVE   RBC / HPF 11-20 0 - 5 RBC/hpf   WBC, UA 21-50 0 - 5 WBC/hpf   Bacteria, UA FEW (A) NONE SEEN   Squamous Epithelial / LPF 6-10 0 - 5   WBC Clumps PRESENT    Mucus PRESENT   I-Stat beta hCG blood, ED  Result Value Ref Range   I-stat hCG, quantitative <5.0 <5 mIU/mL   Comment 3           US Pelvis Complete  Result Date: 12/12/2017 CLINICAL DATA:  Right lower quadrant pain. EXAM: TRANSABDOMINAL ULTRASOUND OF PELVIS DOPPLER ULTRASOUND OF OVARIES TECHNIQUE: Transabdominal ultrasound examination of the pelvis was performed including evaluation of the uterus, ovaries, adnexal regions, and pelvic cul-de-sac. Color and  duplex Doppler ultrasound was utilized to evaluate blood flow to the ovaries. COMPARISON:  None. FINDINGS: Uterus Measurements: 7.4 x 3.0 x 4.4 cm. No fibroids or other mass visualized. Endometrium Thickness: 4.6 mm.  No focal abnormality visualized. Right ovary Measurements: 3.3 x 2.8 x 2.7 cm. Normal appearance/no adnexal mass. Left ovary Measurements: 2.5 x 2.2 x 2.3 cm. Normal appearance/no adnexal mass. Pulsed Doppler evaluation demonstrates normal low-resistance arterial and venous waveforms in both ovaries. Other: No other abnormalities. IMPRESSION: The study is limited without endovaginal imaging. However, no abnormalities are seen. Electronically Signed   By: Gerome Sam III M.D   On: 12/12/2017 01:05   [place] Art/ven Flow Abd Pelv Doppler  Result Date: 12/12/2017 CLINICAL DATA:  Right lower quadrant pain. EXAM: TRANSABDOMINAL ULTRASOUND OF PELVIS DOPPLER ULTRASOUND OF OVARIES TECHNIQUE: Transabdominal ultrasound examination of the pelvis was performed including evaluation of the uterus, ovaries, adnexal regions, and pelvic cul-de-sac. Color and duplex Doppler ultrasound was utilized to evaluate blood flow to the ovaries. COMPARISON:   None. FINDINGS: Uterus Measurements: 7.4 x 3.0 x 4.4 cm. No fibroids or other mass visualized. Endometrium Thickness: 4.6 mm.  No focal abnormality visualized. Right ovary Measurements: 3.3 x 2.8 x 2.7 cm. Normal appearance/no adnexal mass. Left ovary Measurements: 2.5 x 2.2 x 2.3 cm. Normal appearance/no adnexal mass. Pulsed Doppler evaluation demonstrates normal low-resistance arterial and venous waveforms in both ovaries. Other: No other abnormalities. IMPRESSION: The study is limited without endovaginal imaging. However, no abnormalities are seen. Electronically Signed   By: Gerome Sam III M.D   On: 12/12/2017 01:05   Ct Renal Stone Study  Result Date: 12/11/2017 CLINICAL DATA:  Right lower quadrant pain for 2 days. History of appendectomy. EXAM: CT ABDOMEN AND PELVIS WITHOUT CONTRAST TECHNIQUE: Multidetector CT imaging of the abdomen and pelvis was performed following the standard protocol without IV contrast. COMPARISON:  None. FINDINGS: Lower chest: Normal heart size.  Clear lung bases. Hepatobiliary: No focal liver abnormality is seen. Status post cholecystectomy. No biliary dilatation. Pancreas: Unremarkable. No pancreatic ductal dilatation or surrounding inflammatory changes. Spleen: Normal in size without focal abnormality. Adrenals/Urinary Tract: Adrenal glands are unremarkable. Kidneys are normal, without renal calculi, focal lesion, or hydronephrosis. Bladder is decompressed in appearance. A punctate calcification is seen within the bladder adjacent to the right UVJ potentially representing a recently passed stone. Stomach/Bowel: Status post appendectomy. No bowel obstruction or inflammation. Normal bowel rotation. Unremarkable decompressed appearance of the stomach. Vascular/Lymphatic: No significant vascular findings are present. No enlarged abdominal or pelvic lymph nodes. A few tiny right lower quadrant mesenteric lymph nodes are identified without adenopathy. Reproductive: Uterus is  anteverted and in the lower uterine segment, there is a coarse calcification to the right of midline suspicious for small subserosal leiomyoma. Other: Small fat containing periumbilical hernia. No abdominopelvic ascites. Musculoskeletal: Lumbosacral transitional vertebral anatomy. No suspicious nor acute osseous abnormality. IMPRESSION: 1. A punctate calculus, series 2/79 within the bladder along the expected course of the distal right ureter potentially representing a recently passed stone. No hydronephrosis is noted. 2. Status post appendectomy. No bowel obstruction or inflammation. Small subcentimeter right lower quadrant lymph nodes not felt to represent mesenteric adenitis given their relatively small size. Electronically Signed   By: Tollie Eth M.D.   On: 12/11/2017 23:51    Pelvic ultrasound without acute findings (patient refused transvaginal portion).  Symptoms likely due to recently passed right sided stone.  Will d/c home with scripts per PA [facility].  Will return here for any  new/acute changes.   Garlon Hatchet, PA-C 12/12/17 Shirlee Latch, April, MD 12/12/17 [phone]

## 2017-12-12 NOTE — ED Provider Notes (Signed)
[facility]-EMERGENCY DEPT Provider Note   CSN: [id number] Arrival date & time: 12/11/17  1842     History   Chief Complaint Chief Complaint  Patient presents with  . Abdominal Pain    Right lower quadrent    HPI Katherine Rivas is a 27 y.o. female.  HPI   Patient is a 26 year old female with a history of bipolar 1 disorder, PTSD, obesity, anxiety, and asthma presenting for right lower quadrant pain.  Patient reports that symptoms began 2 nights ago, and have been progressively worsening.  She describes the pain as sharp and constant with intermittent waves of pain.  Patient reports she vomited once yesterday and has had low appetite.  Last bowel movement today normal for patient.  Patient reports slight increase in vaginal discharge but does not report any vaginal irritation or discomfort.  Patient reports menses starting early, and feeling that this menses is lighter than prior.  Patient reports sexual activity with one female partner within the last 3 months, with whom she is no longer sexually active.  Does not think this individual could have exposed to STI but patient is not sure.  Patient has no history of STI.  Abdominal surgical history significant for appendectomy and cholecystectomy.  Past Medical History:  Diagnosis Date  . Anxiety   . Asthma   . Bipolar 1 disorder (HCC)   . Obesity   . PTSD (post-traumatic stress disorder)   . Scoliosis     There are no active problems to display for this patient.   Past Surgical History:  Procedure Laterality Date  . ADENOIDECTOMY    . APPENDECTOMY    . CHOLECYSTECTOMY    . KNEE SURGERY    . WISDOM TOOTH EXTRACTION       OB History   None      Home Medications    Prior to Admission medications   Medication Sig Start Date End Date Taking? Authorizing Provider  busPIRone (BUSPAR) 10 MG tablet Take 10 mg by mouth 2 (two) times daily.    [provider]  fluconazole (DIFLUCAN) 150 MG  tablet Take 1 tablet (150 mg total) by mouth daily for 1 day. Take 72 hours after initial dose. 12/12/17 12/13/17  Aviva Kluver B, PA-C  gabapentin (NEURONTIN) 300 MG capsule Take 600 mg by mouth daily.    [provider]  hydrOXYzine (VISTARIL) 100 MG capsule Take 100 mg by mouth 2 (two) times daily as needed for anxiety.     [provider]  ibuprofen (ADVIL,MOTRIN) 200 MG tablet Take 400-600 mg by mouth every 6 (six) hours as needed for moderate pain.    [provider]  lamoTRIgine (LAMICTAL) 25 MG tablet Take 25 mg by mouth daily.    [provider]  naproxen (NAPROSYN) 500 MG tablet Take 1 tablet (500 mg total) by mouth 2 (two) times daily. 12/12/17   Aviva Kluver B, PA-C  traZODone (DESYREL) 100 MG tablet Take 200 mg by mouth at bedtime.    [provider]    Family History Family History  Problem Relation Age of Onset  . Anxiety disorder Mother     Social History Social History   Tobacco Use  . Smoking status: Current Every Day Smoker    Packs/day: 0.35    Types: Cigarettes  . Smokeless tobacco: Never Used  Substance Use Topics  . Alcohol use: Yes    Comment: occ  . Drug use: No     Allergies  Pumpkin flavor and Zithromax [azithromycin]   Review of Systems Review of Systems  Constitutional: Negative for chills and fever.  HENT: Negative for congestion and sore throat.   Eyes: Negative for visual disturbance.  Respiratory: Negative for chest tightness and shortness of breath.   Cardiovascular: Negative for chest pain.  Gastrointestinal: Positive for abdominal pain and nausea. Negative for vomiting.  Genitourinary: Positive for pelvic pain and vaginal bleeding. Negative for dysuria and flank pain.  Musculoskeletal: Negative for back pain and myalgias.  Skin: Negative for rash.  Neurological: Negative for dizziness and syncope.     Physical Exam Updated Vital Signs BP 111/70 (BP Location: Left Arm)   Pulse 84    Temp 98.2 F (36.8 C) (Oral)   Resp 16   Ht 5\' 7"  (1.702 m)   Wt 122.5 kg   LMP 12/11/2017   SpO2 100%   BMI 42.29 kg/m   Physical Exam  Constitutional: She appears well-developed and well-nourished. No distress.  HENT:  Head: Normocephalic and atraumatic.  Mouth/Throat: Oropharynx is clear and moist.  Eyes: Pupils are equal, round, and reactive to light. Conjunctivae and EOM are normal.  Neck: Normal range of motion. Neck supple.  Cardiovascular: Normal rate, regular rhythm, S1 normal and S2 normal.  No murmur heard. Pulmonary/Chest: Effort normal and breath sounds normal. She has no wheezes. She has no rales.  Abdominal: Soft. She exhibits no distension. There is tenderness in the right lower quadrant. There is no guarding and no CVA tenderness.  Genitourinary:  Genitourinary Comments: Examination performed with nurse tech chaperone present.  Patient has no lymphadenopathy or lesions of the vagina or perineum.  Vaginal tissue is rugated and pink.  Cervix is not erythematous and nonfriable.  There is large amount of blood within the vaginal vault. On bimanual exam, patient does not have cervical motion tenderness, tenderness of uterine fundus, but is uncomfortable with palpation of the right adnexa. No left adnexa TTP.   Musculoskeletal: Normal range of motion. She exhibits no edema or deformity.  Lymphadenopathy:    She has no cervical adenopathy.  Neurological: She is alert.  Cranial nerves grossly intact. Patient moves extremities symmetrically and with good coordination.  Skin: Skin is warm and dry. No rash noted. No erythema.  Psychiatric: She has a normal mood and affect. Her behavior is normal. Judgment and thought content normal.  Nursing note and vitals reviewed.    ED Treatments / Results  Labs (all labs ordered are listed, but only abnormal results are displayed) Labs Reviewed  WET PREP, GENITAL - Abnormal; Notable for the following components:      Result Value     Yeast Wet Prep HPF POC PRESENT (*)    WBC, Wet Prep HPF POC MANY (*)    All other components within normal limits  LIPASE, BLOOD - Abnormal; Notable for the following components:   Lipase 56 (*)    All other components within normal limits  COMPREHENSIVE METABOLIC PANEL - Abnormal; Notable for the following components:   Glucose, Bld 120 (*)    All other components within normal limits  CBC - Abnormal; Notable for the following components:   Hemoglobin 15.7 (*)    All other components within normal limits  URINALYSIS, ROUTINE W REFLEX MICROSCOPIC - Abnormal; Notable for the following components:   APPearance HAZY (*)    Hgb urine dipstick LARGE (*)    Protein, ur 30 (*)    Leukocytes, UA SMALL (*)    Bacteria, UA FEW (*)  All other components within normal limits  RPR  HIV ANTIBODY (ROUTINE TESTING W REFLEX)  I-STAT BETA HCG BLOOD, ED ([facility], [facility], [facility] ONLY)  GC/CHLAMYDIA PROBE AMP ([place]) NOT AT [facility]    EKG None  Radiology Ct Renal Stone Study  Result Date: 12/11/2017 CLINICAL DATA:  Right lower quadrant pain for 2 days. History of appendectomy. EXAM: CT ABDOMEN AND PELVIS WITHOUT CONTRAST TECHNIQUE: Multidetector CT imaging of the abdomen and pelvis was performed following the standard protocol without IV contrast. COMPARISON:  None. FINDINGS: Lower chest: Normal heart size.  Clear lung bases. Hepatobiliary: No focal liver abnormality is seen. Status post cholecystectomy. No biliary dilatation. Pancreas: Unremarkable. No pancreatic ductal dilatation or surrounding inflammatory changes. Spleen: Normal in size without focal abnormality. Adrenals/Urinary Tract: Adrenal glands are unremarkable. Kidneys are normal, without renal calculi, focal lesion, or hydronephrosis. Bladder is decompressed in appearance. A punctate calcification is seen within the bladder adjacent to the right UVJ potentially representing a recently passed stone. Stomach/Bowel: Status post appendectomy. No bowel  obstruction or inflammation. Normal bowel rotation. Unremarkable decompressed appearance of the stomach. Vascular/Lymphatic: No significant vascular findings are present. No enlarged abdominal or pelvic lymph nodes. A few tiny right lower quadrant mesenteric lymph nodes are identified without adenopathy. Reproductive: Uterus is anteverted and in the lower uterine segment, there is a coarse calcification to the right of midline suspicious for small subserosal leiomyoma. Other: Small fat containing periumbilical hernia. No abdominopelvic ascites. Musculoskeletal: Lumbosacral transitional vertebral anatomy. No suspicious nor acute osseous abnormality. IMPRESSION: 1. A punctate calculus, series 2/79 within the bladder along the expected course of the distal right ureter potentially representing a recently passed stone. No hydronephrosis is noted. 2. Status post appendectomy. No bowel obstruction or inflammation. Small subcentimeter right lower quadrant lymph nodes not felt to represent mesenteric adenitis given their relatively small size. Electronically Signed   By: Tollie Eth M.D.   On: 12/11/2017 23:51    Procedures Procedures (including critical care time)  Medications Ordered in ED Medications  ketorolac (TORADOL) 15 MG/ML injection 15 mg (has no administration in time range)  fluconazole (DIFLUCAN) tablet 150 mg (has no administration in time range)  sodium chloride 0.9 % bolus 1,000 mL (1,000 mLs Intravenous New Bag/Given 12/11/17 2258)  morphine 4 MG/ML injection 4 mg (4 mg Intravenous Given 12/11/17 2255)  ondansetron (ZOFRAN) injection 4 mg (4 mg Intravenous Given 12/11/17 2255)     Initial Impression / Assessment and Plan / ED Course  I have reviewed the triage vital signs and the nursing notes.  Pertinent labs & imaging results that were available during my care of the patient were reviewed by me and considered in my medical decision making (see chart for details).  Clinical Course as of  Dec 12 49  Sat Dec 11, 2017  2043 Likely 2/2 not eating. Not 3x ULN and not suggestive of pancreatitis.   Lipase(!): 56 [AM]  Sun Dec 12, 2017  1610 Patient currently menstruating and has nephrolithiasis.  Hgb urine dipstick(!): LARGE [AM]    Clinical Course User Index [AM] Elisha Ponder, PA-C    Patient is nontoxic-appearing, afebrile, and in no acute distress.  Patient has benign abdomen.  Differential diagnosis includes PID, ovarian cyst rupture, nephrolithiasis, UTI, STI,   Patient has many bacteria as well as yeast present.  Patient did not have pelvic exam concerning for PID.  Patient has evidence of recently passed stone into the bladder as well as residual right-sided hydronephrosis, which is likely  the cause of her pain.  No evidence of UTI.  Patient is moving in 4 days, and I instructed patient to follow-up with urology when she establishes care in her new residence.  Pelvic ultrasound is pending.  To be followed by Sharilyn Sites, PA-C.  Return precautions were given for any increasing pain, intractable nausea vomiting, or fever.  Patient is in understanding and agrees with plan of care.  Final Clinical Impressions(s) / ED Diagnoses   Final diagnoses:  Ureteral stone  Vaginal yeast infection    ED Discharge Orders         Ordered    naproxen (NAPROSYN) 500 MG tablet  2 times daily     12/12/17 0046    fluconazole (DIFLUCAN) 150 MG tablet  Daily     12/12/17 0046           Elisha Ponder, PA-C 12/12/17 0100    Mesner, Barbara Cower, MD 12/12/17 Windell Moment

## 2017-12-13 LAB — GC/CHLAMYDIA PROBE AMP (~~LOC~~) NOT AT ARMC
Chlamydia: NEGATIVE
Neisseria Gonorrhea: NEGATIVE
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
